# Patient Record
Sex: Male | Born: 1995 | Race: White | Hispanic: No | Marital: Married | Smoking: Never smoker
Health system: Southern US, Community
[De-identification: ages and names within clinical notes are randomized; demographics above are authoritative.]

---
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
## 2016-02-20 IMAGING — US US ART/VEN ABD/PELV/SCROTUM DOPPLER LTD
1 series · 14 of 25 positions shown · non-contrast
Comparison: None.

CLINICAL DATA: Left testicular pain x2 days

EXAM:
SCROTAL ULTRASOUND
DOPPLER ULTRASOUND OF THE TESTICLES
TECHNIQUE: Complete ultrasound examination of the testicles, epididymis, and
other scrotal structures was performed. Color and spectral Doppler
ultrasound were also utilized to evaluate blood flow to the
testicles.

[Series 1: us art/ven abd/pelv/scrotum doppler ltd · 0.06mm/px · 14 of 63 slices shown]
[im 1/63]
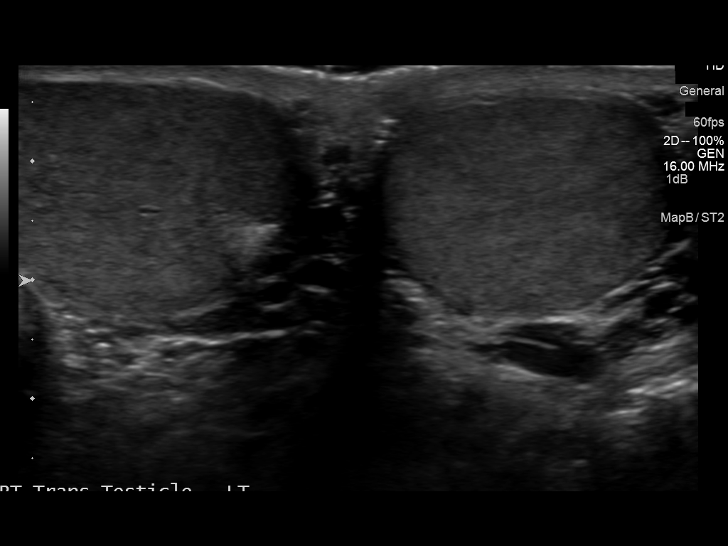
[im 6/63]
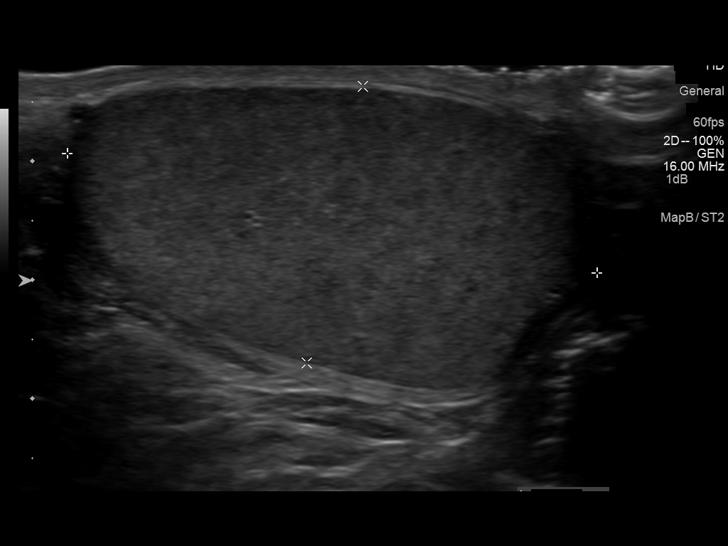
[im 11/63]
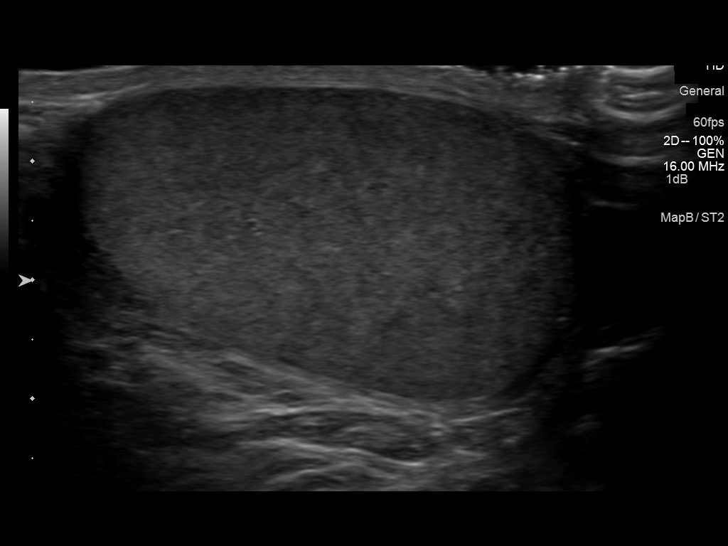
[im 16/63]
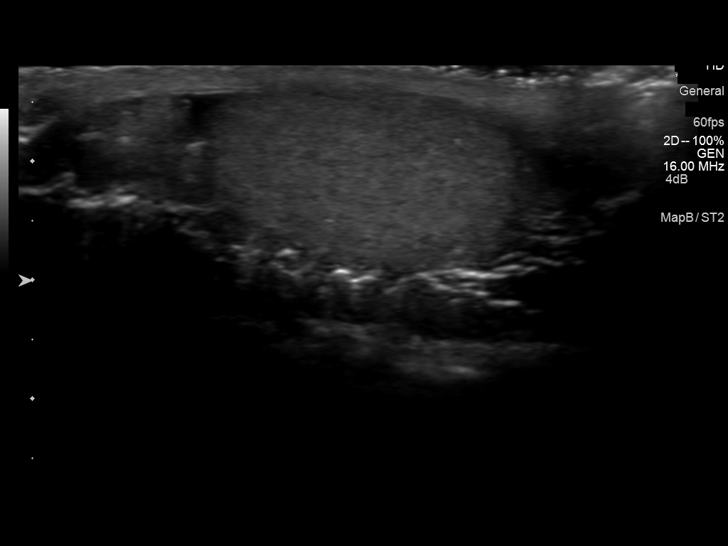
[im 21/63]
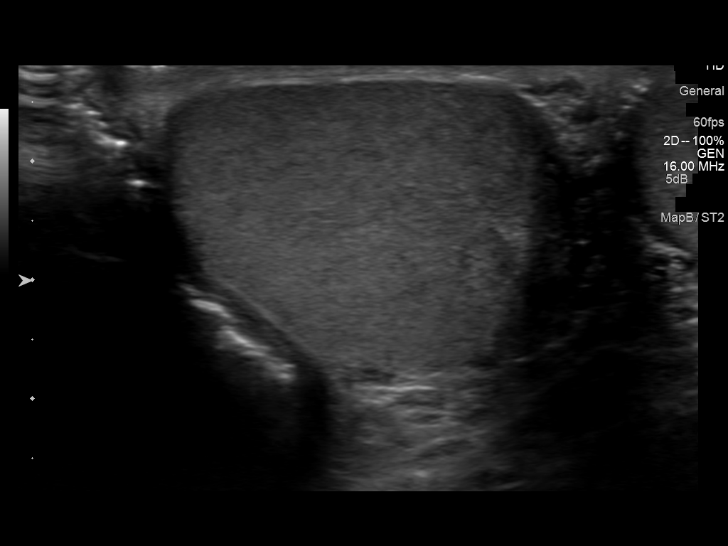
[im 24/63]
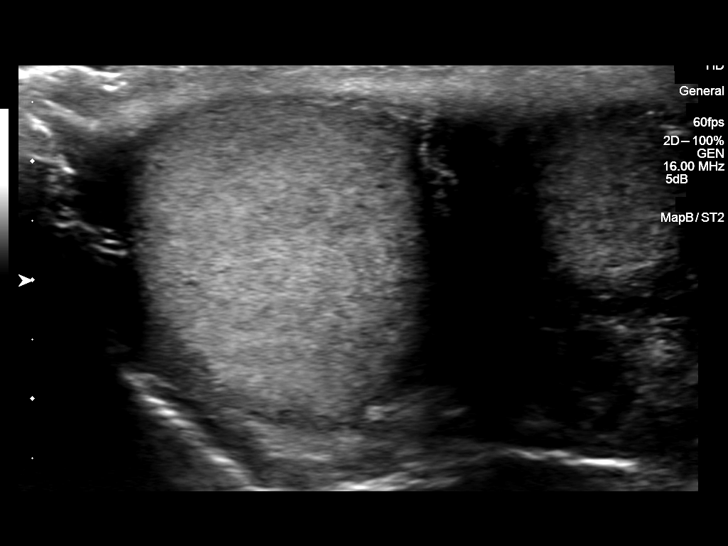
[im 29/63]
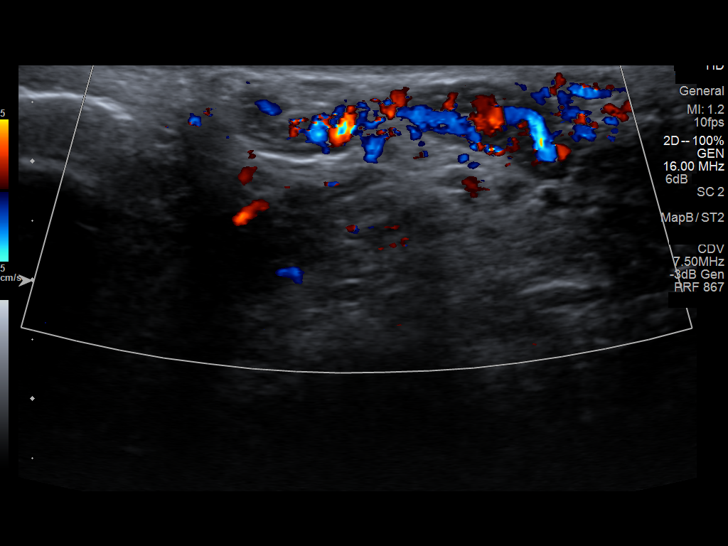
[im 34/63]
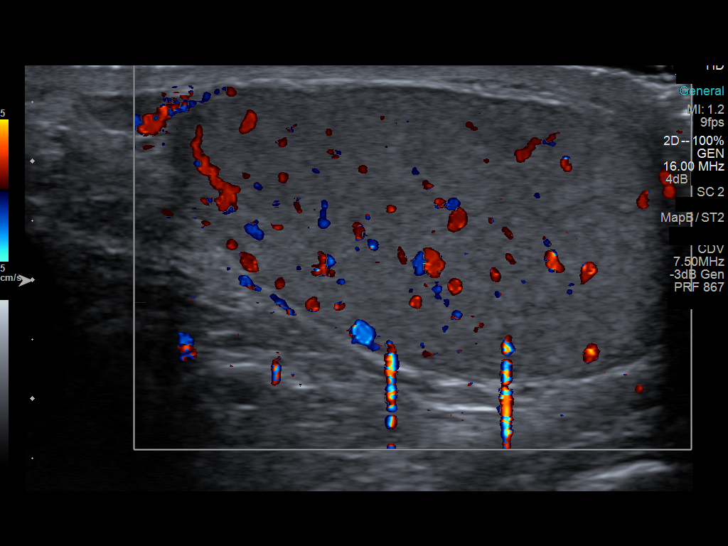
[im 39/63]
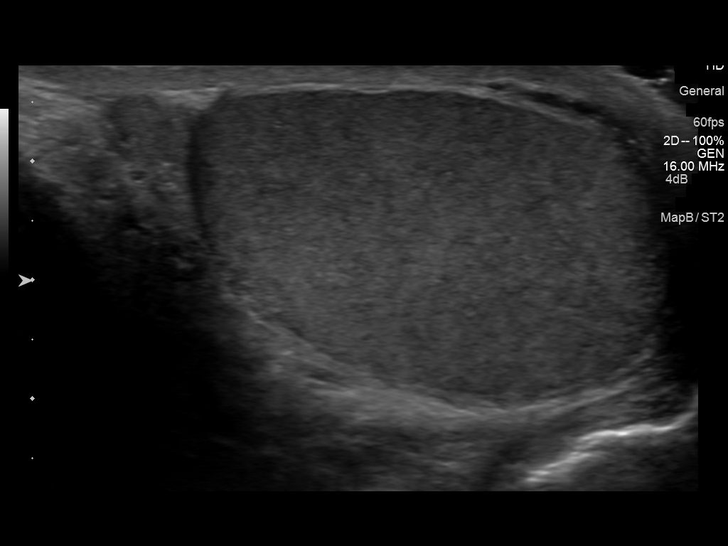
[im 42/63]
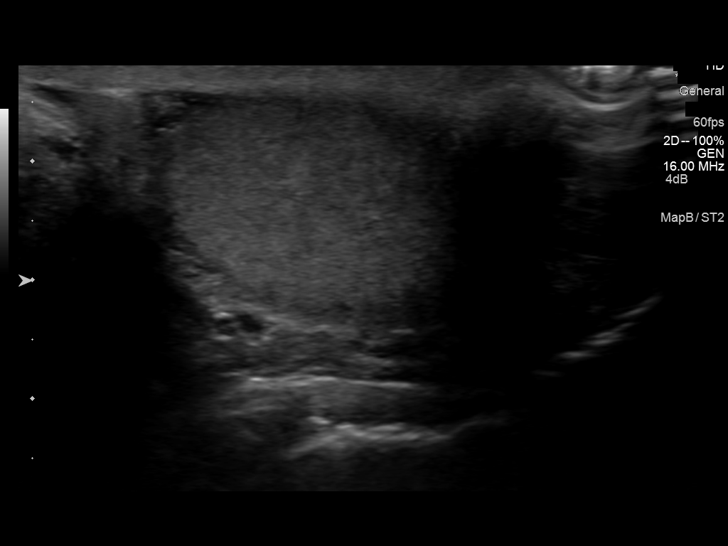
[im 47/63]
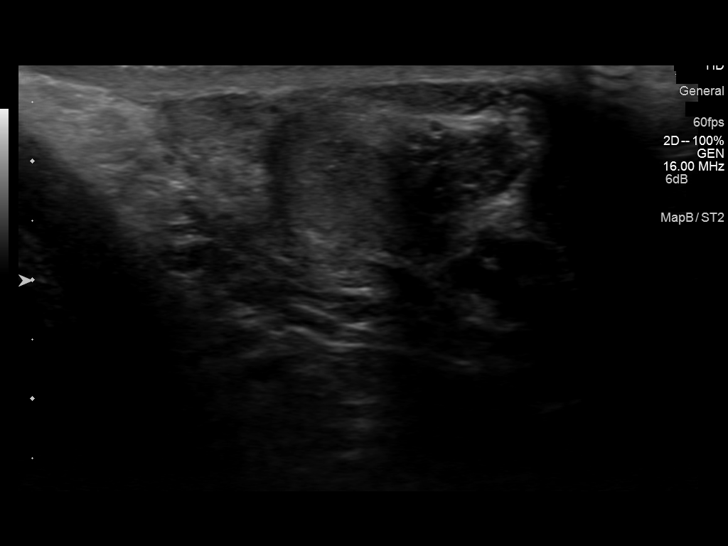
[im 52/63]
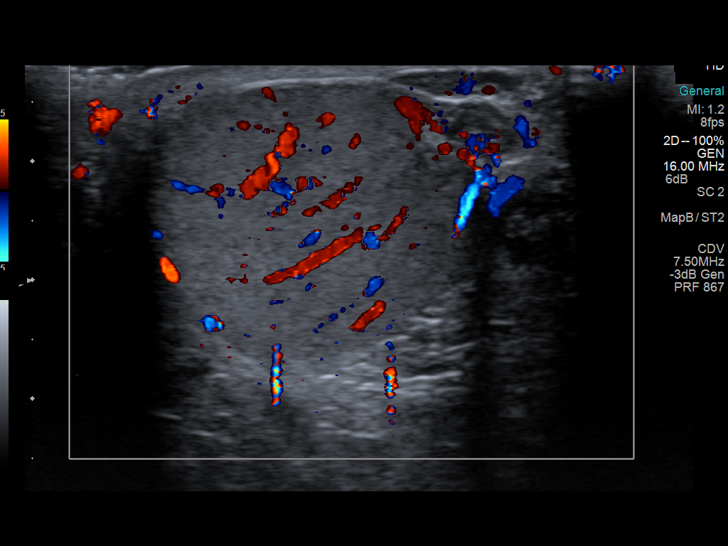
[im 57/63]
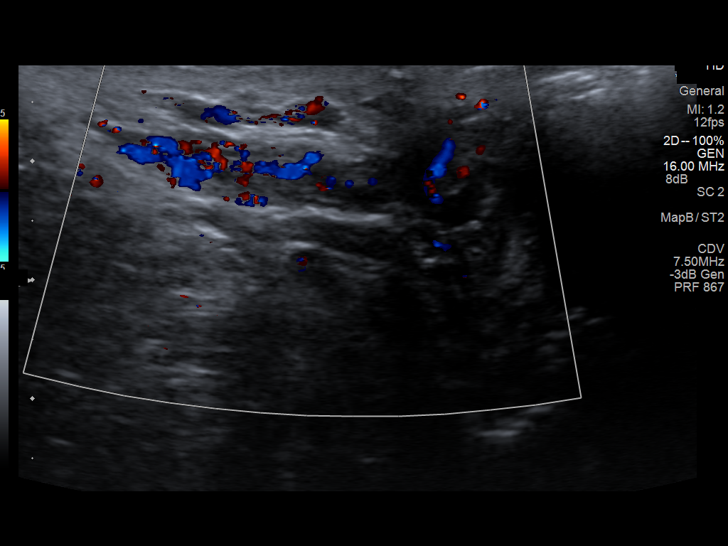
[im 63/63]
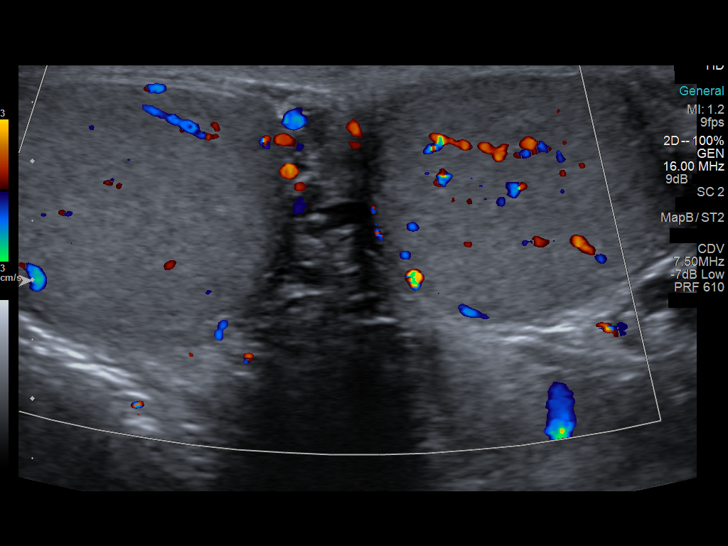

[14 of 25 positions shown; findings below may reference images not displayed]

FINDINGS: Right testicle

Measurements: 4.6 x 2.4 x 3.3 cm. No mass or microlithiasis
visualized.

Left testicle

Measurements: 4.6 x 2.3 x 2.7 cm. No mass or microlithiasis
visualized.

Right epididymis:  Normal in size and appearance.

Left epididymis:  Normal in size and appearance.

Hydrocele:  None visualized.

Varicocele:  Present bilaterally.

Pulsed Doppler interrogation of both testes demonstrates normal low
resistance arterial and venous waveforms bilaterally.
IMPRESSION: Normal sonographic appearance of the bilateral testes.

Bilateral scrotal varicoceles.

No evidence of testicular torsion.

## 2016-12-19 ENCOUNTER — Emergency Department (HOSPITAL_COMMUNITY)
Admission: EM | Admit: 2016-12-19 | Discharge: 2016-12-19 | Disposition: A | Discharge status: Home / Self Care | Payer: Self-pay | Attending: Physician Assistant | Admitting: Physician Assistant

## 2016-12-19 ENCOUNTER — Encounter (HOSPITAL_COMMUNITY): Payer: Self-pay

## 2016-12-19 DIAGNOSIS — Z23 Encounter for immunization: Secondary | ICD-10-CM | POA: Insufficient documentation

## 2016-12-19 DIAGNOSIS — R110 Nausea: Secondary | ICD-10-CM | POA: Insufficient documentation

## 2016-12-19 DIAGNOSIS — S61210A Laceration without foreign body of right index finger without damage to nail, initial encounter: Secondary | ICD-10-CM | POA: Insufficient documentation

## 2016-12-19 DIAGNOSIS — Y929 Unspecified place or not applicable: Secondary | ICD-10-CM | POA: Insufficient documentation

## 2016-12-19 DIAGNOSIS — Y9389 Activity, other specified: Secondary | ICD-10-CM | POA: Insufficient documentation

## 2016-12-19 DIAGNOSIS — Y999 Unspecified external cause status: Secondary | ICD-10-CM | POA: Insufficient documentation

## 2016-12-19 DIAGNOSIS — X58XXXA Exposure to other specified factors, initial encounter: Secondary | ICD-10-CM | POA: Insufficient documentation

## 2016-12-19 MED ORDER — LIDOCAINE HCL (PF) 1 % IJ SOLN
5.0000 mL | Freq: Once | INTRAMUSCULAR | Status: AC
  Administered 2016-12-19: 5 mL
  Filled 2016-12-19: qty 5

## 2016-12-19 MED ORDER — TETANUS-DIPHTH-ACELL PERTUSSIS 5-2.5-18.5 LF-MCG/0.5 IM SUSP
0.5000 mL | Freq: Once | INTRAMUSCULAR | Status: AC
  Administered 2016-12-19: 0.5 mL via INTRAMUSCULAR
  Filled 2016-12-19: qty 0.5

## 2016-12-19 NOTE — ED Triage Notes (Signed)
Pt. Opening ironing board and sliced finger on metal part. Pt. Rates pain 10/10. Bleeding controlled at this time.

## 2016-12-19 NOTE — Discharge Instructions (Signed)
Follow up at your [facility] for suture removal in 7 days or go to Urgent care or return here. Follow up before 7 days if you have problems.

## 2016-12-19 NOTE — ED Provider Notes (Signed)
[facility]-EMERGENCY DEPT Provider Note   CSN: [id number] Arrival date & time: 12/19/16  1522  By signing my name below, I, Orpah CobbMaurice Copeland, attest that this documentation has been prepared under the direction and in the presence of [facility] Ne[facility], NP-C. Electronically Signed: Orpah CobbMaurice Copeland , ED Scribe. 12/19/16. 5:05 PM.     History   Chief Complaint Chief Complaint  Patient presents with  . Extremity Laceration    HPI Derrick Luna is a 21 y.o. male who presents to the Emergency Department complaining of R index finger laceration with onset x1 hour. Pt reportedly sustained a laceration to the R index finger while trying to move an ironing board. Pt rates the the pain 10/10. He denies any modifying factors. Pt denies any other complaints. Of note, pt's tetanus is out of date.   The history is provided by the patient. No language interpreter was used.  Laceration   The incident occurred less than 1 hour ago. Pain location: R index finger. The laceration mechanism was a a metal edge. The pain is at a severity of 10/10. The pain is severe. The pain has been constant since onset. He reports no foreign bodies present. His tetanus status is out of date.    History reviewed. No pertinent past medical history.  There are no active problems to display for this patient.   History reviewed. No pertinent surgical history.     Home Medications    Prior to Admission medications   Not on File    Family History History reviewed. No pertinent family history.  Social History Social History  Substance Use Topics  . Smoking status: Never Smoker  . Smokeless tobacco: Never Used  . Alcohol use No     Allergies   Patient has no known allergies.   Review of Systems Review of Systems  Constitutional: Negative for fever.  Gastrointestinal: Positive for nausea (mild due to pain).  Musculoskeletal: Positive for arthralgias.  Skin: Positive for wound (R index finger with x2 lacerations).   Neurological: Negative for syncope and headaches.     Physical Exam Updated Vital Signs BP 115/60 (BP Location: Right Arm)   Pulse 87   Temp 98.9 F (37.2 C) (Oral)   Resp 18   Ht 5\' 10"  (1.778 m)   Wt 83.9 kg (185 lb)   SpO2 97%   BMI 26.54 kg/m   Physical Exam  Constitutional: He is oriented to person, place, and time. He appears well-developed and well-nourished. No distress.  HENT:  Head: Normocephalic.  Eyes: EOM are normal.  Neck: Neck supple.  Cardiovascular: Normal rate.   Pulmonary/Chest: Effort normal.  Musculoskeletal:       Right hand: He exhibits tenderness and laceration. He exhibits normal range of motion and normal capillary refill. Swelling: minimal. Normal sensation noted. Normal strength noted. He exhibits no thumb/finger opposition.  Right index finger with full range of motion, good strength, doubt any tendon involvement.   Neurological: He is alert and oriented to person, place, and time. No cranial nerve deficit.  Skin: Skin is warm and dry. Laceration noted.  x2 lacerations to the palmar aspect of the R index finger. One is at the base of the finger, the other is just directly above the other.   Psychiatric: He has a normal mood and affect. His behavior is normal.  Nursing note and vitals reviewed.    ED Treatments / Results   DIAGNOSTIC STUDIES: Oxygen Saturation is 97% on RA, adequate by my interpretation.  COORDINATION OF CARE: 5:05 PM-Discussed next steps with pt. Pt verbalized understanding and is agreeable with the plan.    Labs (all labs ordered are listed, but only abnormal results are displayed) Labs Reviewed - No data to display   Radiology No results found.  Procedures .Marland KitchenLaceration Repair Date/Time: 12/19/2016 4:43 PM Performed by: Janne Napoleon Authorized by: Bary Castilla LYN   Consent:    Consent obtained:  Verbal   Consent given by:  Patient   Risks discussed:  Infection and poor cosmetic result    Alternatives discussed:  No treatment Anesthesia (see MAR for exact dosages):    Anesthesia method:  Local infiltration (infiltrated with 1% Lidocaine w/o epi 1 cc)   Local anesthetic:  Lidocaine 1% w/o epi Laceration details:    Location:  Finger   Finger location:  R index finger   Length (cm):  1.5 Repair type:    Repair type:  Simple Pre-procedure details:    Preparation:  Patient was prepped and draped in usual sterile fashion and imaging obtained to evaluate for foreign bodies Exploration:    Hemostasis achieved with:  Direct pressure   Wound exploration: wound explored through full range of motion and entire depth of wound probed and visualized     Wound extent: no foreign bodies/material noted, no nerve damage noted and no tendon damage noted     Contaminated: no   Treatment:    Area cleansed with:  Saline   Amount of cleaning:  Standard   Irrigation solution:  Sterile saline   Irrigation method:  Syringe Skin repair:    Repair method:  Sutures   Suture size:  5-0   Suture material:  Prolene   Suture technique:  Simple interrupted   Number of sutures:  3 Approximation:    Approximation:  Close Post-procedure details:    Dressing:  Sterile dressing Comments:     While watching the procedure the patient became pale and had a near syncopal episode. Patient placed in trendelenburg position and recovered quickly.    (including critical care time)  Medications Ordered in ED Medications  lidocaine (PF) (XYLOCAINE) 1 % injection 5 mL (5 mLs Infiltration Given 12/19/16 1622)  Tdap (BOOSTRIX) injection 0.5 mL (0.5 mLs Intramuscular Given 12/19/16 1622)     Initial Impression / Assessment and Plan / ED Course  I have reviewed the triage vital signs and the nursing notes.  Tetanus updated in ED. Laceration occurred < 12 hours prior to repair. Discussed laceration care with pt and answered questions. Pt to f-u for suture removal in 7 days and wound check sooner should there  be signs of dehiscence or infection. Pt is hemodynamically stable with no complaints prior to dc.     Final Clinical Impressions(s) / ED Diagnoses   Final diagnoses:  Laceration of right index finger without foreign body without damage to nail, initial encounter    New Prescriptions There are no discharge medications for this patient. I personally performed the services described in this documentation, which was scribed in my presence. The recorded information has been reviewed and is accurate.    Kerrie Buffalo [place], [place] 12/19/16 1718    Abelino Derrick, MD 12/19/16 2352

## 2022-05-08 DIAGNOSIS — Z0000 Encounter for general adult medical examination without abnormal findings: Secondary | ICD-10-CM | POA: Diagnosis not present

## 2022-05-08 DIAGNOSIS — N5089 Other specified disorders of the male genital organs: Secondary | ICD-10-CM | POA: Diagnosis not present

## 2022-05-08 DIAGNOSIS — K644 Residual hemorrhoidal skin tags: Secondary | ICD-10-CM | POA: Diagnosis not present

## 2022-05-09 ENCOUNTER — Other Ambulatory Visit: Payer: Self-pay | Admitting: Family Medicine

## 2022-05-09 DIAGNOSIS — N5089 Other specified disorders of the male genital organs: Secondary | ICD-10-CM

## 2022-05-15 ENCOUNTER — Ambulatory Visit
Admission: RE | Admit: 2022-05-15 | Discharge: 2022-05-15 | Disposition: A | Discharge status: Home / Self Care | Payer: BC Managed Care – PPO | Source: Ambulatory Visit | Attending: Family Medicine | Admitting: Family Medicine

## 2022-05-15 DIAGNOSIS — N5089 Other specified disorders of the male genital organs: Secondary | ICD-10-CM

## 2022-05-15 DIAGNOSIS — I861 Scrotal varices: Secondary | ICD-10-CM | POA: Diagnosis not present

## 2022-12-13 ENCOUNTER — Other Ambulatory Visit: Payer: Self-pay

## 2022-12-13 ENCOUNTER — Emergency Department (HOSPITAL_BASED_OUTPATIENT_CLINIC_OR_DEPARTMENT_OTHER)
Admission: EM | Admit: 2022-12-13 | Discharge: 2022-12-13 | Disposition: A | Discharge status: Home / Self Care | Payer: BC Managed Care – PPO | Attending: Emergency Medicine | Admitting: Emergency Medicine

## 2022-12-13 ENCOUNTER — Encounter (HOSPITAL_BASED_OUTPATIENT_CLINIC_OR_DEPARTMENT_OTHER): Payer: Self-pay

## 2022-12-13 ENCOUNTER — Emergency Department (HOSPITAL_BASED_OUTPATIENT_CLINIC_OR_DEPARTMENT_OTHER): Payer: BC Managed Care – PPO

## 2022-12-13 DIAGNOSIS — R55 Syncope and collapse: Secondary | ICD-10-CM | POA: Diagnosis not present

## 2022-12-13 DIAGNOSIS — R7989 Other specified abnormal findings of blood chemistry: Secondary | ICD-10-CM | POA: Insufficient documentation

## 2022-12-13 DIAGNOSIS — W2119XA Struck by other bat, racquet or club, initial encounter: Secondary | ICD-10-CM | POA: Insufficient documentation

## 2022-12-13 DIAGNOSIS — S00211A Abrasion of right eyelid and periocular area, initial encounter: Secondary | ICD-10-CM | POA: Diagnosis not present

## 2022-12-13 DIAGNOSIS — R519 Headache, unspecified: Secondary | ICD-10-CM | POA: Diagnosis not present

## 2022-12-13 DIAGNOSIS — S0990XA Unspecified injury of head, initial encounter: Secondary | ICD-10-CM

## 2022-12-13 DIAGNOSIS — T148XXA Other injury of unspecified body region, initial encounter: Secondary | ICD-10-CM

## 2022-12-13 DIAGNOSIS — M542 Cervicalgia: Secondary | ICD-10-CM | POA: Diagnosis not present

## 2022-12-13 DIAGNOSIS — Y9373 Activity, racquet and hand sports: Secondary | ICD-10-CM | POA: Diagnosis not present

## 2022-12-13 LAB — BASIC METABOLIC PANEL
Anion gap: 12 (ref 5–15)
BUN: 22 mg/dL — ABNORMAL HIGH (ref 6–20)
CO2: 25 mmol/L (ref 22–32)
Calcium: 9.8 mg/dL (ref 8.9–10.3)
Chloride: 101 mmol/L (ref 98–111)
Creatinine, Ser: 1.25 mg/dL — ABNORMAL HIGH (ref 0.61–1.24)
GFR, Estimated: >60 mL/min (ref >60)
Glucose, Bld: 128 mg/dL — ABNORMAL HIGH (ref 70–99)
Potassium: 3.7 mmol/L (ref 3.5–5.1)
Sodium: 138 mmol/L (ref 135–145)

## 2022-12-13 LAB — CBC
HCT: 43.8 % (ref 39.0–52.0)
Hemoglobin: 14.9 g/dL (ref 13.0–17.0)
MCH: 30.1 pg (ref 26.0–34.0)
MCHC: 34 g/dL (ref 30.0–36.0)
MCV: 88.5 fL (ref 80.0–100.0)
Platelets: 217 10*3/uL (ref 150–400)
RBC: 4.95 MIL/uL (ref 4.22–5.81)
RDW: 12.1 % (ref 11.5–15.5)
WBC: 10.7 10*3/uL — ABNORMAL HIGH (ref 4.0–10.5)
nRBC: 0 % (ref 0.0–0.2)

## 2022-12-13 LAB — CBG MONITORING, ED: Glucose-Capillary: 142 mg/dL — ABNORMAL HIGH (ref 70–99)

## 2022-12-13 NOTE — Discharge Instructions (Signed)
It was a pleasure taking care of you here in the emergency department  As discussed in the room your workup was reassuring.  You did have a mildly elevated creatinine which is your kidney function.  I recommend increasing fluids, follow-up with primary care provider for reassessment.  With regards to the abrasion on your forehead this does not require sutures.  Let warm soapy water run over the area.  Place Band-Aid.  Return for new or worsening symptoms

## 2022-12-13 NOTE — ED Triage Notes (Signed)
Patient here POV from Home.  Endorses Syncopal Episode today after taking a Shower today 30 minutes ago. Prior to he had hit his forehead with a racket. States after he showered he sat down and fell backwards hitting his posterior Head on the wall.  NAD Noted during Triage. A&Ox4. GCS 15. BIB Wheelchair.

## 2022-12-13 NOTE — ED Provider Notes (Signed)
[place] EMERGENCY DEPARTMENT AT [facility] Provider Note   CSN: [id number] Arrival date & time: 12/13/22  1914     History  Chief Complaint  Patient presents with   Loss of Consciousness    Derrick Luna is a 27 y.o. male no significant past medical history here for evaluation of syncopal episode.  Patient was playing pickle ball earlier today he accidentally hit himself on the forehead with a racquet.  He went home to take shower to wash the blood off of him when he looked in the mirror became lightheaded and passed out.  He subsequently fell backwards hitting the occiput of his head and his neck.  He has a mild headache and neck pain.  States he does typically pass out when he gets blood drawn or sees blood.  He was fine and otherwise well prior to the incident.  He denies any vision changes, chest pain, shortness of breath, nausea, vomiting, pain or swelling to lower extremities.  Tetanus Up to date  HPI     Home Medications Prior to Admission medications   Not on File      Allergies    Patient has no known allergies.    Review of Systems   Review of Systems  Constitutional: Negative.   HENT: Negative.    Respiratory: Negative.    Cardiovascular: Negative.   Gastrointestinal: Negative.   Genitourinary: Negative.   Musculoskeletal: Negative.   Skin:  Positive for wound.  Neurological:  Positive for syncope. Negative for dizziness, tremors, seizures, facial asymmetry, speech difficulty, weakness, light-headedness, numbness and headaches.  All other systems reviewed and are negative.   Physical Exam Updated Vital Signs BP 116/79 (BP Location: Left Arm)   Pulse 71   Temp (!) 96.5 F (35.8 C)   Resp (!) 22   Ht 5\' 10"  (1.778 m)   Wt 79.4 kg   SpO2 100%   BMI 25.11 kg/m  Physical Exam Vitals and nursing note reviewed.  Constitutional:      General: He is not in acute distress.    Appearance: He is well-developed. He is not ill-appearing,  toxic-appearing or diaphoretic.  HENT:     Head: Normocephalic.     Comments: 4mm abrasion superior right eyebrow, no active bleeding or drainage.  No raccoon eye, Battle sign    Nose: Nose normal.     Mouth/Throat:     Mouth: Mucous membranes are moist.  Eyes:     Pupils: Pupils are equal, round, and reactive to light.  Neck:     Comments: Mild tenderness superior cervical region Cardiovascular:     Rate and Rhythm: Normal rate and regular rhythm.     Pulses: Normal pulses.     Heart sounds: Normal heart sounds.  Pulmonary:     Effort: Pulmonary effort is normal. No respiratory distress.     Breath sounds: Normal breath sounds.     Comments: Clear bilaterally, speaks in full sentences without difficulty Abdominal:     General: Bowel sounds are normal. There is no distension.     Palpations: Abdomen is soft.     Tenderness: There is no abdominal tenderness. There is no right CVA tenderness, left CVA tenderness, guarding or rebound.     Comments: Soft, nontender  Musculoskeletal:        General: No swelling, tenderness, deformity or signs of injury. Normal range of motion.     Cervical back: Normal range of motion and neck supple.  Right lower leg: No edema.     Left lower leg: No edema.     Comments: No tenderness midline T/L-spine region.  Nontender bilateral upper and lower extremities, lifts arms overhead without difficulty.  Flexes and extends at bilateral lower extremities without difficulty, compartments soft, nontender posterior calves.  Skin:    General: Skin is warm and dry.     Capillary Refill: Capillary refill takes less than 2 seconds.     Comments: Abrasion versus superficial skin tear above right eyebrow.  No active bleeding or drainage.  Neurological:     General: No focal deficit present.     Mental Status: He is alert and oriented to person, place, and time.     Cranial Nerves: No cranial nerve deficit.     Sensory: No sensory deficit.     Motor: No  weakness.     Gait: Gait normal.     ED Results / Procedures / Treatments   Labs (all labs ordered are listed, but only abnormal results are displayed) Labs Reviewed  BASIC METABOLIC PANEL - Abnormal; Notable for the following components:      Result Value   Glucose, Bld 128 (*)    BUN 22 (*)    Creatinine, Ser 1.25 (*)    All other components within normal limits  CBC - Abnormal; Notable for the following components:   WBC 10.7 (*)    All other components within normal limits  CBG MONITORING, ED - Abnormal; Notable for the following components:   Glucose-Capillary 142 (*)    All other components within normal limits    EKG None  Radiology CT Cervical Spine Wo Contrast  Result Date: 12/13/2022 CLINICAL DATA:  Syncope. EXAM: CT CERVICAL SPINE WITHOUT CONTRAST TECHNIQUE: Multidetector CT imaging of the cervical spine was performed without intravenous contrast. Multiplanar CT image reconstructions were also generated. RADIATION DOSE REDUCTION: This exam was performed according to the departmental dose-optimization program which includes automated exposure control, adjustment of the mA and/or kV according to patient size and/or use of iterative reconstruction technique. COMPARISON:  None Available. FINDINGS: Alignment: Normal. Skull base and vertebrae: No acute fracture. No primary bone lesion or focal pathologic process. Soft tissues and spinal canal: No prevertebral fluid or swelling. No visible canal hematoma. Disc levels: Normal multilevel endplates are seen with normal multilevel intervertebral disc spaces. Normal, bilateral multilevel facet joints are noted. Upper chest: Negative. Other: None. IMPRESSION: Normal CT of the cervical spine. Electronically Signed   By: Aram Candela M.D.   On: 12/13/2022 21:51   CT Head Wo Contrast  Result Date: 12/13/2022 CLINICAL DATA:  Syncope. EXAM: CT HEAD WITHOUT CONTRAST TECHNIQUE: Contiguous axial images were obtained from the base of the  skull through the vertex without intravenous contrast. RADIATION DOSE REDUCTION: This exam was performed according to the departmental dose-optimization program which includes automated exposure control, adjustment of the mA and/or kV according to patient size and/or use of iterative reconstruction technique. COMPARISON:  None Available. FINDINGS: Brain: No evidence of acute infarction, hemorrhage, hydrocephalus, extra-axial collection or mass lesion/mass effect. Vascular: No hyperdense vessel or unexpected calcification. Skull: Normal. Negative for fracture or focal lesion. Sinuses/Orbits: No acute finding. Other: None. IMPRESSION: Normal head CT. Electronically Signed   By: Aram Candela M.D.   On: 12/13/2022 21:50    Procedures Procedures    Medications Ordered in ED Medications - No data to display  ED Course/ Medical Decision Making/ A&P   27 year old here for evaluation of syncope.  Sounds like he was playing pickle ball earlier today when he hit his forehead with a racquet.  He subsequently went home, showered to get the blood off of him when he looked at the area in the mirror and it was bleeding he had a syncopal episode falling backwards and hitting the occiput of his head as well as his neck on an object.  He does have prior history of syncope when dealing with blood.  He was otherwise well prior to this.  States he feels otherwise well currently.  He has a nonfocal neuroexam without deficits.  His heart and lungs are clear.  Abdomen soft, tender.  No clinical evidence of VTE on exam.  No history of arrhythmia.  He has very superficial 4 mm laceration versus abrasion to his right superficial eyebrow.  Does not require suturing or gluing.  No active bleeding.  Will plan on labs, imaging and reassess  Labs and imaging personally viewed and interpreted:  CBC leukocytosis 10.7 Metabolic panel creatinine 1.25, no prior to compare EKG without ischemic changes CT head without significant  findings CT cervical without significant findings  I discussed results with patient, family in room.  Will DC home with symptomatic management.  Keep wound clean, dry.  Follow-up outpatient, return for new or worsening symptoms.  Given his mildly elevated creatinine I suggested increasing fluids, recheck at PCP.  Low suspicion for rhabdomyolysis  The patient has been appropriately medically screened and/or stabilized in the ED. I have low suspicion for any other emergent medical condition which would require further screening, evaluation or treatment in the ED or require inpatient management.  Patient is hemodynamically stable and in no acute distress.  Patient able to ambulate in department prior to ED.  Evaluation does not show acute pathology that would require ongoing or additional emergent interventions while in the emergency department or further inpatient treatment.  I have discussed the diagnosis with the patient and answered all questions.  Pain is been managed while in the emergency department and patient has no further complaints prior to discharge.  Patient is comfortable with plan discussed in room and is stable for discharge at this time.  I have discussed strict return precautions for returning to the emergency department.  Patient was encouraged to follow-up with PCP/specialist refer to at discharge.                             Medical Decision Making Amount and/or Complexity of Data Reviewed Independent Historian: friend External Data Reviewed: labs, radiology, ECG and notes. Labs: ordered. Decision-making details documented in ED Course. Radiology: ordered and independent interpretation performed. Decision-making details documented in ED Course. ECG/medicine tests: ordered and independent interpretation performed. Decision-making details documented in ED Course.  Risk OTC drugs. Decision regarding hospitalization. Diagnosis or treatment significantly limited by social  determinants of health.           Final Clinical Impression(s) / ED Diagnoses Final diagnoses:  Injury of head, initial encounter  Syncope, unspecified syncope type  Abrasion  Elevated serum creatinine    Rx / DC Orders ED Discharge Orders     None         Brihanna Devenport A, PA-C 12/13/22 2220    Elayne Snare K, DO 12/13/22 2344

## 2022-12-21 DIAGNOSIS — R55 Syncope and collapse: Secondary | ICD-10-CM | POA: Diagnosis not present

## 2022-12-21 DIAGNOSIS — R944 Abnormal results of kidney function studies: Secondary | ICD-10-CM | POA: Diagnosis not present

## 2024-02-18 ENCOUNTER — Ambulatory Visit (INDEPENDENT_AMBULATORY_CARE_PROVIDER_SITE_OTHER): Admitting: Physician Assistant

## 2024-02-18 ENCOUNTER — Encounter: Payer: Self-pay | Admitting: Physician Assistant

## 2024-02-18 VITALS — BP 119/73

## 2024-02-18 DIAGNOSIS — S90211A Contusion of right great toe with damage to nail, initial encounter: Secondary | ICD-10-CM

## 2024-02-18 DIAGNOSIS — B360 Pityriasis versicolor: Secondary | ICD-10-CM

## 2024-02-18 DIAGNOSIS — L814 Other melanin hyperpigmentation: Secondary | ICD-10-CM

## 2024-02-18 DIAGNOSIS — W908XXA Exposure to other nonionizing radiation, initial encounter: Secondary | ICD-10-CM

## 2024-02-18 DIAGNOSIS — D1801 Hemangioma of skin and subcutaneous tissue: Secondary | ICD-10-CM

## 2024-02-18 DIAGNOSIS — Z1283 Encounter for screening for malignant neoplasm of skin: Secondary | ICD-10-CM

## 2024-02-18 DIAGNOSIS — L578 Other skin changes due to chronic exposure to nonionizing radiation: Secondary | ICD-10-CM

## 2024-02-18 DIAGNOSIS — D229 Melanocytic nevi, unspecified: Secondary | ICD-10-CM

## 2024-02-18 DIAGNOSIS — L821 Other seborrheic keratosis: Secondary | ICD-10-CM | POA: Diagnosis not present

## 2024-02-18 MED ORDER — KETOCONAZOLE 2 % EX SHAM: 1.0000 | MEDICATED_SHAMPOO | CUTANEOUS | 11 refills | Status: AC

## 2024-02-18 NOTE — Patient Instructions (Signed)

## 2024-02-18 NOTE — Progress Notes (Signed)
 New Patient Visit   Subjective  Derrick Luna is a 28 y.o. male NEW PATIENT who presents for the following: Full skin exam. Pt states he has no places of concern he just wants to get checked out. Pt does have a family history is skin cancer his grandfather but he is not sure what kind.   Other concern: he has known tinea versicolor. Has used OTC dandruff shampoo in the past.     The following portions of the chart were reviewed this encounter and updated as appropriate: medications, allergies, medical history  Review of Systems:  No other skin or systemic complaints except as noted in HPI or Assessment and Plan.  Objective  Well appearing patient in no apparent distress; mood and affect are within normal limits.  A full examination was performed including scalp, head, eyes, ears, nose, lips, neck, chest, axillae, abdomen, back, buttocks, bilateral upper extremities, bilateral lower extremities, hands, feet, fingers, toes, fingernails, and toenails. All findings within normal limits unless otherwise noted below.    Relevant exam findings are noted in the Assessment and Plan.    Assessment & Plan   LENTIGINES, SEBORRHEIC KERATOSES, HEMANGIOMAS - Benign normal skin lesions - Benign-appearing - Call for any changes  MELANOCYTIC NEVI - Tan-brown and/or pink-flesh-colored symmetric macules and papules - Benign appearing on exam today - Observation - Call clinic for new or changing moles - Recommend daily use of broad spectrum spf 30+ sunscreen to sun-exposed areas.   ACTINIC DAMAGE - Chronic condition, secondary to cumulative UV/sun exposure - diffuse scaly erythematous macules with underlying dyspigmentation - Recommend daily broad spectrum sunscreen SPF 30+ to sun-exposed areas, reapply every 2 hours as needed.  - Staying in the shade or wearing long sleeves, sun glasses (UVA+UVB protection) and wide brim hats (4-inch brim around the entire circumference of the hat) are also  recommended for sun protection.  - Call for new or changing lesions.  SKIN CANCER SCREENING PERFORMED TODAY   TINEA VERSICOLOR  - Use ketoconazole  shampoo as body wash daily until clear and then once a week for maintenance , especially in summer months  -Tinea versicolor is a chronic recurrent skin rash causing discolored scaly spots most commonly seen on back, chest, and/or shoulders.  It is generally asymptomatic. The rash is due to overgrowth of a common type of yeast present on everyone's skin and it is not contagious.  It tends to flare more in the summer due to increased sweating on trunk.  After rash is treated, the scaliness will resolve, but the discoloration will take longer to return to normal pigmentation. The periodic use of an OTC medicated soap/shampoo with zinc or selenium sulfide can be helpful to prevent yeast overgrowth and recurrence.  Subungual hematoma  Exam:  Right great toe  Treatment Plan: Benign-appearing.  Observation.  Call clinic for new or changing lesions.  Recommend daily use of broad spectrum spf 30+ sunscreen to sun-exposed areas.       SCREENING EXAM FOR SKIN CANCER   LENTIGINES   SEBORRHEIC KERATOSIS   CHERRY ANGIOMA   MULTIPLE BENIGN NEVI   ACTINIC SKIN DAMAGE   SUBUNGUAL HEMATOMA OF GREAT TOE OF RIGHT FOOT, INITIAL ENCOUNTER    Return in about 2 years (around 02/17/2026) for TBSE.  I, Doyce Pan, CMA, am acting as scribe for Mccrae Speciale K, PA-C.   Documentation: I have reviewed the above documentation for accuracy and completeness, and I agree with the above.  Jesseca Marsch K, PA-C

## 2024-07-09 ENCOUNTER — Emergency Department (HOSPITAL_BASED_OUTPATIENT_CLINIC_OR_DEPARTMENT_OTHER)

## 2024-07-09 ENCOUNTER — Emergency Department (HOSPITAL_BASED_OUTPATIENT_CLINIC_OR_DEPARTMENT_OTHER)
Admission: EM | Admit: 2024-07-09 | Discharge: 2024-07-09 | Disposition: A | Discharge status: Home / Self Care | Attending: Emergency Medicine | Admitting: Emergency Medicine

## 2024-07-09 ENCOUNTER — Other Ambulatory Visit: Payer: Self-pay

## 2024-07-09 ENCOUNTER — Encounter (HOSPITAL_BASED_OUTPATIENT_CLINIC_OR_DEPARTMENT_OTHER): Payer: Self-pay

## 2024-07-09 DIAGNOSIS — S93401A Sprain of unspecified ligament of right ankle, initial encounter: Secondary | ICD-10-CM | POA: Insufficient documentation

## 2024-07-09 DIAGNOSIS — W2105XA Struck by basketball, initial encounter: Secondary | ICD-10-CM | POA: Diagnosis not present

## 2024-07-09 DIAGNOSIS — Y9367 Activity, basketball: Secondary | ICD-10-CM | POA: Insufficient documentation

## 2024-07-09 DIAGNOSIS — S99911A Unspecified injury of right ankle, initial encounter: Secondary | ICD-10-CM | POA: Diagnosis present

## 2024-07-09 NOTE — ED Provider Notes (Signed)
 " [place] EMERGENCY DEPARTMENT AT [facility] Provider Note   CSN: [id number] Arrival date & time: 07/09/24  1919     Patient presents with: No chief complaint on file.   Derrick Luna is a 29 y.o. male.   Patient is a 29 year old who presents with pain in his right ankle.  He said he was actually upstairs at this facility playing basketball and when he was coming down from a rebound he landed on someone else's foot and rolled his ankle.  He has pain and swelling to the outside part of his right ankle.  He denies any other injuries.       Prior to Admission medications  Medication Sig Start Date End Date Taking? Authorizing Provider  ketoconazole  (NIZORAL ) 2 % shampoo Apply 1 Application topically as directed. Let sit 10 mins then rinse apply to trunk daily until clear then weekly for maintenance 02/18/24   Sandridge, Brenda K, PA-C    Allergies: Patient has no known allergies.    Review of Systems  Constitutional:  Negative for fever.  Gastrointestinal:  Negative for nausea and vomiting.  Musculoskeletal:  Positive for arthralgias and joint swelling. Negative for back pain and neck pain.  Skin:  Negative for wound.  Neurological:  Negative for weakness, numbness and headaches.    Updated Vital Signs BP 105/82 (BP Location: Left Arm)   Pulse 98   Temp 100.1 F (37.8 C) (Oral)   Resp 20   Ht 5' 9 (1.753 m)   Wt 83.9 kg   SpO2 97%   BMI 27.32 kg/m   Physical Exam Constitutional:      Appearance: He is well-developed.  HENT:     Head: Normocephalic and atraumatic.  Cardiovascular:     Rate and Rhythm: Normal rate.  Pulmonary:     Effort: Pulmonary effort is normal.  Musculoskeletal:        General: Tenderness present.     Cervical back: Normal range of motion and neck supple.     Comments: Positive tenderness over the medial malleolus of the right ankle.  There is tenderness to the surrounding ligaments.  No pain to the foot.  No pain to the proximal  fibula.  He has normal sensation and motor function of the foot.  Radial pulses intact.  No open wounds.  Skin:    General: Skin is warm and dry.  Neurological:     Mental Status: He is alert and oriented to person, place, and time.     (all labs ordered are listed, but only abnormal results are displayed) Labs Reviewed - No data to display  EKG: None  Radiology: DG Ankle Complete Right Result Date: 07/09/2024 EXAM: 3 OR MORE VIEW(S) XRAY OF THE RIGHT ANKLE 07/09/2024 07:44:00 PM CLINICAL HISTORY: right ankle pain after ankle injury playing basketball. COMPARISON: None available. FINDINGS: BONES AND JOINTS: No acute fracture. No malalignment. SOFT TISSUES: Unremarkable. IMPRESSION: 1. No acute fracture or dislocation. Electronically signed by: Elsie Gravely MD 07/09/2024 07:47 PM EST RP Workstation: [id number]     Procedures   Medications Ordered in the ED - No data to display                                  Medical Decision Making Amount and/or Complexity of Data Reviewed Radiology: ordered.   Patient presents with an injury to his right ankle.  Differential diagnosis includes fracture, ankle sprain, Ma[place]uve  fracture  X-rays of the right ankle were performed and shows no evidence of fracture.  This was interpreted by me and confirmed by the radiologist.  Patient was advised and ice and elevation.  A Velcro ankle splint was placed.  He was given information about following up with orthopedics if his symptoms are not improving in the next week.  Return precautions given.     Final diagnoses:  Sprain of right ankle, unspecified ligament, initial encounter    ED Discharge Orders     None          Lenor Hollering, MD 07/09/24 2120  "

## 2024-07-09 NOTE — Discharge Instructions (Signed)
 Call to make an appointment with the orthopedist listed above if your symptoms are improving in the next week.  Return to the emergency room if you have any worsening symptoms.

## 2026-02-21 ENCOUNTER — Ambulatory Visit: Admitting: Physician Assistant
